# Patient Record
Sex: Female | Born: 2000 | Race: White | Hispanic: No | Marital: Single | Smoking: Never smoker
Health system: Southern US, Community
[De-identification: ages and names within clinical notes are randomized; demographics above are authoritative.]

## PROBLEM LIST (undated history)

## (undated) DIAGNOSIS — J45909 Unspecified asthma, uncomplicated: Secondary | ICD-10-CM

---
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
## 2022-09-23 DIAGNOSIS — R300 Dysuria: Secondary | ICD-10-CM | POA: Diagnosis not present

## 2022-10-04 DIAGNOSIS — F411 Generalized anxiety disorder: Secondary | ICD-10-CM | POA: Diagnosis not present

## 2022-10-04 DIAGNOSIS — F422 Mixed obsessional thoughts and acts: Secondary | ICD-10-CM | POA: Diagnosis not present

## 2022-10-10 DIAGNOSIS — J0100 Acute maxillary sinusitis, unspecified: Secondary | ICD-10-CM | POA: Diagnosis not present

## 2022-11-04 DIAGNOSIS — R051 Acute cough: Secondary | ICD-10-CM | POA: Diagnosis not present

## 2022-11-04 DIAGNOSIS — R0981 Nasal congestion: Secondary | ICD-10-CM | POA: Diagnosis not present

## 2022-11-04 DIAGNOSIS — J324 Chronic pansinusitis: Secondary | ICD-10-CM | POA: Diagnosis not present

## 2022-11-18 DIAGNOSIS — F411 Generalized anxiety disorder: Secondary | ICD-10-CM | POA: Diagnosis not present

## 2022-12-19 DIAGNOSIS — N3091 Cystitis, unspecified with hematuria: Secondary | ICD-10-CM | POA: Diagnosis not present

## 2022-12-19 DIAGNOSIS — R300 Dysuria: Secondary | ICD-10-CM | POA: Diagnosis not present

## 2022-12-19 DIAGNOSIS — R809 Proteinuria, unspecified: Secondary | ICD-10-CM | POA: Diagnosis not present

## 2022-12-19 DIAGNOSIS — R319 Hematuria, unspecified: Secondary | ICD-10-CM | POA: Diagnosis not present

## 2022-12-19 DIAGNOSIS — R8281 Pyuria: Secondary | ICD-10-CM | POA: Diagnosis not present

## 2023-01-13 DIAGNOSIS — F411 Generalized anxiety disorder: Secondary | ICD-10-CM | POA: Diagnosis not present

## 2023-02-05 DIAGNOSIS — Z0000 Encounter for general adult medical examination without abnormal findings: Secondary | ICD-10-CM | POA: Diagnosis not present

## 2023-02-05 DIAGNOSIS — Z118 Encounter for screening for other infectious and parasitic diseases: Secondary | ICD-10-CM | POA: Diagnosis not present

## 2023-02-05 DIAGNOSIS — Z113 Encounter for screening for infections with a predominantly sexual mode of transmission: Secondary | ICD-10-CM | POA: Diagnosis not present

## 2023-02-19 DIAGNOSIS — M545 Low back pain, unspecified: Secondary | ICD-10-CM | POA: Diagnosis not present

## 2023-02-19 DIAGNOSIS — G43009 Migraine without aura, not intractable, without status migrainosus: Secondary | ICD-10-CM | POA: Diagnosis not present

## 2023-02-27 DIAGNOSIS — J4550 Severe persistent asthma, uncomplicated: Secondary | ICD-10-CM | POA: Diagnosis not present

## 2023-02-27 DIAGNOSIS — J3089 Other allergic rhinitis: Secondary | ICD-10-CM | POA: Diagnosis not present

## 2023-02-27 DIAGNOSIS — J301 Allergic rhinitis due to pollen: Secondary | ICD-10-CM | POA: Diagnosis not present

## 2023-03-04 DIAGNOSIS — M545 Low back pain, unspecified: Secondary | ICD-10-CM | POA: Diagnosis not present

## 2023-03-12 DIAGNOSIS — F411 Generalized anxiety disorder: Secondary | ICD-10-CM | POA: Diagnosis not present

## 2023-03-16 DIAGNOSIS — R0981 Nasal congestion: Secondary | ICD-10-CM | POA: Diagnosis not present

## 2023-03-16 DIAGNOSIS — R059 Cough, unspecified: Secondary | ICD-10-CM | POA: Diagnosis not present

## 2023-05-12 DIAGNOSIS — R300 Dysuria: Secondary | ICD-10-CM | POA: Diagnosis not present

## 2023-06-06 DIAGNOSIS — F411 Generalized anxiety disorder: Secondary | ICD-10-CM | POA: Diagnosis not present

## 2023-09-25 ENCOUNTER — Encounter (HOSPITAL_BASED_OUTPATIENT_CLINIC_OR_DEPARTMENT_OTHER): Payer: Self-pay | Admitting: Emergency Medicine

## 2023-09-25 ENCOUNTER — Other Ambulatory Visit: Payer: Self-pay

## 2023-09-25 ENCOUNTER — Emergency Department (HOSPITAL_BASED_OUTPATIENT_CLINIC_OR_DEPARTMENT_OTHER)
Admission: EM | Admit: 2023-09-25 | Discharge: 2023-09-25 | Disposition: A | Discharge status: Home / Self Care | Attending: Emergency Medicine | Admitting: Emergency Medicine

## 2023-09-25 DIAGNOSIS — J45909 Unspecified asthma, uncomplicated: Secondary | ICD-10-CM | POA: Diagnosis not present

## 2023-09-25 DIAGNOSIS — N3000 Acute cystitis without hematuria: Secondary | ICD-10-CM | POA: Insufficient documentation

## 2023-09-25 DIAGNOSIS — R059 Cough, unspecified: Secondary | ICD-10-CM | POA: Diagnosis not present

## 2023-09-25 LAB — RESP PANEL BY RT-PCR (RSV, FLU A&B, COVID)  RVPGX2
Influenza A by PCR: NEGATIVE
Influenza B by PCR: NEGATIVE
Resp Syncytial Virus by PCR: NEGATIVE
SARS Coronavirus 2 by RT PCR: NEGATIVE

## 2023-09-25 LAB — URINALYSIS, ROUTINE W REFLEX MICROSCOPIC
Bilirubin Urine: NEGATIVE
Glucose, UA: NEGATIVE mg/dL
Hgb urine dipstick: NEGATIVE
Ketones, ur: NEGATIVE mg/dL
Nitrite: NEGATIVE
Protein, ur: NEGATIVE mg/dL
Specific Gravity, Urine: 1.005 (ref 1.005–1.030)
pH: 5 (ref 5.0–8.0)

## 2023-09-25 MED ORDER — IPRATROPIUM-ALBUTEROL 0.5-2.5 (3) MG/3ML IN SOLN
3.0000 mL | Freq: Once | RESPIRATORY_TRACT | Status: AC
  Administered 2023-09-25: 3 mL via RESPIRATORY_TRACT
  Filled 2023-09-25: qty 3

## 2023-09-25 MED ORDER — CEPHALEXIN 500 MG PO CAPS: 500.0000 mg | ORAL_CAPSULE | Freq: Three times a day (TID) | ORAL | 0 refills | Status: AC

## 2023-09-25 MED ORDER — IPRATROPIUM-ALBUTEROL 0.5-2.5 (3) MG/3ML IN SOLN: 3.0000 mL | RESPIRATORY_TRACT | 0 refills | Status: AC | PRN

## 2023-09-25 MED ORDER — PREDNISONE 20 MG PO TABS: 40.0000 mg | ORAL_TABLET | Freq: Every day | ORAL | 0 refills | Status: AC

## 2023-09-25 MED ORDER — CEPHALEXIN 250 MG PO CAPS
500.0000 mg | ORAL_CAPSULE | Freq: Once | ORAL | Status: AC
  Administered 2023-09-25: 500 mg via ORAL
  Filled 2023-09-25: qty 2

## 2023-09-25 MED ORDER — PREDNISONE 50 MG PO TABS
50.0000 mg | ORAL_TABLET | Freq: Once | ORAL | Status: AC
  Administered 2023-09-25: 50 mg via ORAL
  Filled 2023-09-25: qty 1

## 2023-09-25 NOTE — ED Triage Notes (Signed)
 Sinus pressure cough headache  Some sob with with hx of asthma X 2 weeks getting worse  Used inhaler and neb treatment without relief

## 2023-09-25 NOTE — ED Provider Notes (Signed)
 [place] EMERGENCY DEPARTMENT AT [facility] Provider Note   CSN: [id number] Arrival date & time: 09/25/23  1703     History Chief Complaint  Patient presents with   Cough    HPI Sarah Murillo is a 23 y.o. female presenting for cough and SOB. States that she is also having some dysuria.  States that urinary tract infections are acute on chronic from prior.  She denies fevers chills nausea vomiting syncope. Concerning her wheezing, she states that she has a history of asthma.  Is on Trelegy has been compliant but still having ongoing symptoms requiring very frequent utilization of her albuterol inhaler.  Patient's recorded medical, surgical, social, medication list and allergies were reviewed in the Snapshot window as part of the initial history.   Review of Systems   Review of Systems  Constitutional:  Negative for chills and fever.  HENT:  Negative for ear pain and sore throat.   Eyes:  Negative for pain and visual disturbance.  Respiratory:  Positive for shortness of breath. Negative for cough.   Cardiovascular:  Negative for chest pain and palpitations.  Gastrointestinal:  Negative for abdominal pain and vomiting.  Genitourinary:  Negative for dysuria and hematuria.  Musculoskeletal:  Negative for arthralgias and back pain.  Skin:  Negative for color change and rash.  Neurological:  Negative for seizures and syncope.  All other systems reviewed and are negative.   Physical Exam Updated Vital Signs BP 121/64 (BP Location: Right Arm)   Pulse 94   Temp 97.8 F (36.6 C)   Resp 17   LMP 09/04/2023 (Approximate)   SpO2 100%  Physical Exam Vitals and nursing note reviewed.  Constitutional:      General: She is not in acute distress.    Appearance: She is well-developed.  HENT:     Head: Normocephalic and atraumatic.  Eyes:     Conjunctiva/sclera: Conjunctivae normal.  Cardiovascular:     Rate and Rhythm: Normal rate and regular rhythm.     Heart  sounds: No murmur heard. Pulmonary:     Effort: Pulmonary effort is normal. No respiratory distress.     Breath sounds: Normal breath sounds.  Abdominal:     General: There is no distension.     Palpations: Abdomen is soft.     Tenderness: There is no abdominal tenderness. There is no right CVA tenderness or left CVA tenderness.  Musculoskeletal:        General: No swelling or tenderness. Normal range of motion.     Cervical back: Neck supple.  Skin:    General: Skin is warm and dry.  Neurological:     General: No focal deficit present.     Mental Status: She is alert and oriented to person, place, and time. Mental status is at baseline.     Cranial Nerves: No cranial nerve deficit.      ED Course/ Medical Decision Making/ A&P    Procedures Procedures   Medications Ordered in ED Medications  cephALEXin (KEFLEX) capsule 500 mg (has no administration in time range)  ipratropium-albuterol (DUONEB) 0.5-2.5 (3) MG/3ML nebulizer solution 3 mL (3 mLs Nebulization Given 09/25/23 1949)  predniSONE (DELTASONE) tablet 50 mg (50 mg Oral Given 09/25/23 1951)    Medical Decision Making:   23 year old female with 2 separate complaints. Concerning her shortness of breath her history of present illness and physical exam findings are most consistent with asthma exacerbation likely secondary to environmental exposure, time a year/seasonal allergy disease. Also  considered infectious pathology but she was negative for any viral pathology on her swab. Will treat with inhaled bronchodilators, p.o. steroids.  Concerning her dysuria, she has bacteriuria and multiple white blood cells in her urine.  This likely represents a UTI given her feeling of dysuria.  Will treat with Keflex twice daily p.o. recommend follow-up with PCP in 5 days to ensure clearance.  Disposition:  I have considered need for hospitalization, however, considering all of the above, I believe this patient is stable for discharge at  this time.  Patient/family educated about specific return precautions for given chief complaint and symptoms.  Patient/family educated about follow-up with PCP.     Patient/family expressed understanding of return precautions and need for follow-up. Patient spoken to regarding all imaging and laboratory results and appropriate follow up for these results. All education provided in verbal form with additional information in written form. Time was allowed for answering of patient questions. Patient discharged.    Emergency Department Medication Summary:   Medications  cephALEXin (KEFLEX) capsule 500 mg (has no administration in time range)  ipratropium-albuterol (DUONEB) 0.5-2.5 (3) MG/3ML nebulizer solution 3 mL (3 mLs Nebulization Given 09/25/23 1949)  predniSONE (DELTASONE) tablet 50 mg (50 mg Oral Given 09/25/23 1951)        Clinical Impression:  1. Uncomplicated asthma, unspecified asthma severity, unspecified whether persistent   2. Acute cystitis without hematuria      Discharge   Final Clinical Impression(s) / ED Diagnoses Final diagnoses:  Uncomplicated asthma, unspecified asthma severity, unspecified whether persistent  Acute cystitis without hematuria    Rx / DC Orders ED Discharge Orders          Ordered    cephALEXin (KEFLEX) 500 MG capsule  3 times daily        09/25/23 2041    ipratropium-albuterol (DUONEB) 0.5-2.5 (3) MG/3ML SOLN  Every 4 hours PRN        09/25/23 2042    predniSONE (DELTASONE) 20 MG tablet  Daily        09/25/23 2042              Glyn Ade, MD 09/25/23 2042

## 2023-09-25 NOTE — ED Notes (Signed)
 Discharge instructions reviewed.   Newly prescribed medications discussed. Pharmacy verified.   Opportunity for questions and concerns provided.   Alert, oriented and ambulatory. Displays no signs of distress.

## 2023-09-28 LAB — URINE CULTURE: Culture: 20000 — AB

## 2023-09-29 ENCOUNTER — Telehealth (HOSPITAL_BASED_OUTPATIENT_CLINIC_OR_DEPARTMENT_OTHER): Payer: Self-pay | Admitting: *Deleted

## 2023-09-29 NOTE — Telephone Encounter (Signed)
   Post ED Visit - Positive Culture Follow-up  Culture report reviewed by antimicrobial stewardship pharmacist: [facility] []  Enzo Bi, Pharm.D. [x]  JTony Rudsisill, Pharm.D., BCPS AQ-ID []  Garvin Fila, Pharm.D., BCPS []  Georgina Pillion, Pharm.D., BCPS []  [place], [address].D., BCPS, AAHIVP []  Estella Husk, Pharm.D., BCPS, AAHIVP []  Lysle Pearl, PharmD, BCPS []  Phillips Climes, PharmD, BCPS []  Agapito Games, PharmD, BCPS []  Verlan Friends, PharmD []  Mervyn Gay, PharmD, BCPS []  Vinnie Level, PharmD  [facility] []  Len Childs, PharmD []  Greer Pickerel, PharmD []  Adalberto Cole, PharmD []  Perlie Gold, Rph []  Lonell Face) Jean Rosenthal, PharmD []  Earl Many, PharmD []  Junita Push, PharmD []  Dorna Leitz, PharmD []  Terrilee Files, PharmD []  Lynann Beaver, PharmD []  Keturah Barre, PharmD []  Loralee Pacas, PharmD []  Bernadene Person, PharmD   Positive urine culture Treated with Cephalexin, organism sensitive to the same and no further patient follow-up is required at this time.  Bing Quarry 09/29/2023, 8:57 AM

## 2023-11-03 DIAGNOSIS — Z3200 Encounter for pregnancy test, result unknown: Secondary | ICD-10-CM | POA: Diagnosis not present

## 2023-11-03 DIAGNOSIS — N926 Irregular menstruation, unspecified: Secondary | ICD-10-CM | POA: Diagnosis not present

## 2023-11-04 DIAGNOSIS — F411 Generalized anxiety disorder: Secondary | ICD-10-CM | POA: Diagnosis not present

## 2023-11-10 DIAGNOSIS — M549 Dorsalgia, unspecified: Secondary | ICD-10-CM | POA: Diagnosis not present

## 2023-11-10 DIAGNOSIS — Z6836 Body mass index (BMI) 36.0-36.9, adult: Secondary | ICD-10-CM | POA: Diagnosis not present

## 2023-11-12 DIAGNOSIS — M545 Low back pain, unspecified: Secondary | ICD-10-CM | POA: Diagnosis not present

## 2023-11-14 DIAGNOSIS — M545 Low back pain, unspecified: Secondary | ICD-10-CM | POA: Diagnosis not present

## 2023-12-09 DIAGNOSIS — Z713 Dietary counseling and surveillance: Secondary | ICD-10-CM | POA: Diagnosis not present

## 2023-12-09 DIAGNOSIS — Z6835 Body mass index (BMI) 35.0-35.9, adult: Secondary | ICD-10-CM | POA: Diagnosis not present

## 2023-12-09 DIAGNOSIS — N914 Secondary oligomenorrhea: Secondary | ICD-10-CM | POA: Diagnosis not present

## 2023-12-15 DIAGNOSIS — Z3169 Encounter for other general counseling and advice on procreation: Secondary | ICD-10-CM | POA: Diagnosis not present

## 2023-12-26 DIAGNOSIS — Z319 Encounter for procreative management, unspecified: Secondary | ICD-10-CM | POA: Diagnosis not present

## 2023-12-26 DIAGNOSIS — Z3169 Encounter for other general counseling and advice on procreation: Secondary | ICD-10-CM | POA: Diagnosis not present

## 2023-12-31 DIAGNOSIS — Z319 Encounter for procreative management, unspecified: Secondary | ICD-10-CM | POA: Diagnosis not present

## 2024-01-05 DIAGNOSIS — Z319 Encounter for procreative management, unspecified: Secondary | ICD-10-CM | POA: Diagnosis not present

## 2024-01-05 DIAGNOSIS — Z3169 Encounter for other general counseling and advice on procreation: Secondary | ICD-10-CM | POA: Diagnosis not present

## 2024-01-09 ENCOUNTER — Ambulatory Visit: Admission: RE | Admit: 2024-01-09 | Discharge: 2024-01-09 | Disposition: A | Discharge status: Home / Self Care

## 2024-01-09 ENCOUNTER — Ambulatory Visit
Admission: RE | Admit: 2024-01-09 | Discharge: 2024-01-09 | Disposition: A | Discharge status: Home / Self Care | Source: Ambulatory Visit

## 2024-01-09 ENCOUNTER — Other Ambulatory Visit: Payer: Self-pay

## 2024-01-09 DIAGNOSIS — J301 Allergic rhinitis due to pollen: Secondary | ICD-10-CM | POA: Diagnosis not present

## 2024-01-09 DIAGNOSIS — J3089 Other allergic rhinitis: Secondary | ICD-10-CM | POA: Diagnosis not present

## 2024-01-09 DIAGNOSIS — J069 Acute upper respiratory infection, unspecified: Secondary | ICD-10-CM

## 2024-01-09 DIAGNOSIS — J4550 Severe persistent asthma, uncomplicated: Secondary | ICD-10-CM | POA: Diagnosis not present

## 2024-01-09 DIAGNOSIS — J984 Other disorders of lung: Secondary | ICD-10-CM | POA: Diagnosis not present

## 2024-01-12 DIAGNOSIS — Z3169 Encounter for other general counseling and advice on procreation: Secondary | ICD-10-CM | POA: Diagnosis not present

## 2024-01-19 DIAGNOSIS — Z3169 Encounter for other general counseling and advice on procreation: Secondary | ICD-10-CM | POA: Diagnosis not present

## 2024-01-26 DIAGNOSIS — R070 Pain in throat: Secondary | ICD-10-CM | POA: Diagnosis not present

## 2024-01-26 DIAGNOSIS — H6692 Otitis media, unspecified, left ear: Secondary | ICD-10-CM | POA: Diagnosis not present

## 2024-01-27 DIAGNOSIS — Z3169 Encounter for other general counseling and advice on procreation: Secondary | ICD-10-CM | POA: Diagnosis not present

## 2024-01-27 NOTE — Progress Notes (Signed)
 Comprehensive Clinical Assessment   Sarah Murillo  MRN: [id number] September 04, 2000  [id number] - (Managed Care Medicaid)   Time in: 2:42pm Time out: 3:41  Telehealth Patient Information: Patient location during encounter: Home   Audio-only telehealth visit:  No Mode of communication: EPIC Mychart Time spent with patient: 59  minutes Therapist spent 45 minutes reviewing pt chart, documenting, and coordinating care for pt treatment.  Translation services needed via [employer]?  []  Yes  Presenting Problem: Sarah Murillo is a 23 y.o. Caucasian female who presents to the clinic today for a CCA with the following concern(s): PT in for initial CCA evaluation. Says she is seeing another psychiatrist and is looking to switch. Says that she has anxiety, depression, and OCD. Pt was referred by Mom.   Patient Demographics Marital Status:  []  Single     [x]  Married      []  Widowed      []  Separated/Divorced    []  Other Says she got married in April 2025  Sexual Orientation: [x]  Heterosexual orientation   []  Homosexual orientation   []   Bisexual   []   Other  Gender Identity: [x]  Female   []  Female   []   Transgender Female   []  Transgender Female   []  Choose not to disclose   []   Don't Know   []   Unknown    []  Other Preferred Pronouns:     Affirmation Steps Patient has taken, if any (if applicable): [x]  Presentation aligned with gender identity   []  preferred name aligned with gender identity   []   Legal name aligned with gender identity   []  legal sex aligned with gender identity   []  medical or surgical interventions   []   Don't Know   []   Unknown     []  Other/Not Applicable    Who does the the patient reside with? 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    Military History: []  Yes   [x]  No     Patient Strengths, Engagement, and Support System:  [x]  Accepts Feedback           [x]  Capable of Independence     [x]   Clear Thinking  [x]  Confident                        [x]  Expressive/Articulate             [x]   Good Personal Care Habits    [x]  Motivated for Change    [x]  Physically Healthy                  [  x] Positive Support Network    [x]   Reasonable Judgment   [x]  Responsible                 [x]   Stable Living Environment   [x]  Stable Work History       [x]  Supportive Family     [x]  Coping Skills/Resilience    [x]   Integrate Moral Values  [x]  Varied Interests                      [x]  Reliable   [x]  Sociable              [x]  Intelligent        [x]  Insightful  []   Other:      [x]  Currently  []  Formally   []  N/A   active in community/recreational activities/hobbies  Crochet, read books, take dog on walks, hanging out with family, and do photography   Natural/Social Supports: [x]  Supportive network   []  Few supports/ friends  []  Substance related supports   []  No supports  Husband, mom, friends   Electrical Engineer Identity and Religious Beliefs:   Christian   Family History and Family Psychiatric History:  Specify history of mental health illness in your family?   Yes [x]         No[]  Mom, dad and sisters all has anxiety and depression.      Family Member (Information on significant other, siblings, children, pets): Mom and dad, 2 older sisters, also has 2 dogs.     How would you describe your childhood experience?  Normal Environment[x]            Chaotic Environment []   Witnessed physical/verbal/sexual abuse towards others[]      Experienced physical/verbal/sexual abuse[]  Denies    Have you had any recent grief/loss/bereavement?:   Yes []     No[]  Pt reports dad died 2 yrs ago and granddad passed away 2 weeks before her wedding in March    Behavioral and Psychiatric Treatment History:  Prior outpatient therapy or psychiatrist: Therapy [x]   Has been seeing her current psychiatrist for 2 years. Says things are not working out well with them. Says she went to a couple family therapy sessions for her sisters but nothing for herself.   Hospitalizations/ Inpatient Treatment []   Denies  Medication Management[]     Other[]      Medical History: Does the client have any medical conditions?     Yes [x]   No[]  No past medical history on file. Patient Active Problem List  Diagnosis   GAD (generalized anxiety disorder)   Adjustment disorder with depressed mood   Other specified trauma- and stressor-related disorder       History of Traumatic Brain Injury?   Yes []     No[x]      Do you have a Primary Care doctor? Yes [x]      No[]  Dr. Harlene Leff Last Visit/ Physical: Has an appt with them for a yrly physical in 2 wks.     Allergies: Yes []    No[x]           Not on File     Is client taking any OTC, or Rx Medication (Psychotropic/Primary Care): Yes [x]    No []      No current outpatient medications on file.  Takes Cytalipram 40mg  Buspirone 40mg  Trilagy inhaler  Albuterol  inhaler Montelukast 10mg  Hydricizine 25mg  Fluctason     Is client in any pain?: Yes []    No[x]       If  yes, are they being treated for pain? Yes []    No []    No past surgical history on file. No family history on file.    Does the client have Advance Directives in Place?   Yes []      No []                 (If yes, please include information on the effective date and whether resuscitation and/or life support are set in place?)   If no, has the pt had previous discussions about Advance Directives?   Yes []    No []        Not assessed     Nutritional Screening: History of, or, current behaviors indicating an eating disorder (anorexia, bulimia, binging)? Yes []    No[x]  Says that she went on a diet a few yrs ago and loss 50 lbs within a couple months bc she was barely eating. Says she was never diagnosed with an eating dx but def wasn't eating in an effort to lose weight.  Weight gain or weight loss of more than 10lbs in the past 3 months? Yes [x]    No[]  Reports weight gain due to stress eating. Also attributes to infertility meds that she is taking.  Decrease or increase in food intake or appetite? Yes []    No[x]  Have you decreased or increased the amount of food you have consumed? Yes []     No[x]  Issues related to vomiting, nausea, diarrhea, constipation, heartburn/ indigestion?  Yes []    No[x]  Patient is physically active? Yes [x]      No[]  Patient dental Health:   [x]  Good    []  Fair   []  Poor Dental Pain? []   Yes    [x]  No Date of last Dental Exam?   About a yr ago    Referral needed for :   []  Primary Care          []  Dental  For Women:  Are you currently pregnant?   Yes []     No [x]  If so, how far along are you?   Are you receiving prenatal care?   Yes []     No [x]     Are you currently using and substances?  Yes []     No [x]      Do you need a referral for care?  Yes []     No []   Social Determinants of Health Living Situation: [x]  adequate housing []   Dependent on others for housing       []  Homeless []  Other     Are you worried about losing your housing ?   []  Yes [x]  No  Financial concerns: [x]  No concerns   []  Some concerns   []  Poverty or low income    Within the past 12 months, did you worry that your food would run out before you got money to buy more?    []  Yes [x]  No Within the past 12 months, did the food you bought not last and you didn't have money to get more?   []  Yes [x]  No Within the past 12 months, have you or your family members you live with been unable to get utilities (heat, electricity)  when it was really needed ?   []  Yes [x]  No  Do you feel physically and emotionally safe where you currently live?  [x]  Yes []  No Within the past 12 months, have you been hit, slapped, kicked or otherwise physically hurt by someone?   []  Yes [x]  No  Within the past 12 months, have you been humiliated or emotionally abused in other ways by your partner or ex-partner?   []  Yes [x]  No Are any of your needs urgent? (For example, I dont have food for tonight, I dont have a place to sleep tonight, I am afraid I will get hurt if I go home today?)   []  Yes [x]  No   Social Drivers of Health   Tobacco Use: Low Risk  (09/25/2023)   Received from [facility]   Patient History    Smoking Tobacco Use: Never    Smokeless Tobacco Use: Never    Passive Exposure: Not on file  Alcohol  Use: Not on file  Financial Resource Strain: Not on file  Food Insecurity: Not on file  Transportation Needs: Not on file  Housing Stability: Not on file  Physical Activity: Not on file  Stress: Not on file  Social Connections: Not on file  Intimate Partner Violence: Not on file  Depression: Mild depression (01/27/2024)   PHQ-9    PHQ-9 Score: 9  Health Literacy: Not on file     Psychosocial Stressors: []  Problems with Primary Support    []  Problems related to the Social Environment []  Educational Problems []  Occupational Problems   []  Housing Problems     []  Economic Problems []  Problems with Access to Healthcare   []  Problems related to Interactions with Legal []  Other Psychosocial/Environmental Problems [x]  No reported psychosocial stressors  Legal History: Does the client have or has had any issues with the law? Yes []  No[x]  Legal History:     Total time served in Jail/ prison:      Present Problem and Symptoms:  Depression                  [] Depressed Mood                [x] Irritable Mood [] Hypersomnia [x] Difficulty with sleep initiation                 [x] Difficulty with sleep maintenance                  [x] Wakening throughout night                [] Other Sleep Disturbance                 [] Anhedonia [] Feelings of Guilt                 [] Hopelessness                 [x] Decreased energy level                 [] Difficulty concentrating and maintaining focus on tasks                 [] Decreased Appetite                 [] Increased Appetite                 [] Psychomotor Retardation or agitation                 [] Suicidal Ideation                 [] Other   Patient Health Questionnaire-9 Score: (Patient-Rptd) 9 (01/27/2024  2:32 PM) PHQ2/9 Score Interpretation: (Patient-Rptd) Mild symptoms of depression (01/27/2024  2:32 PM)   PHQ9 - Patient reporting PHQ score of 9 indicating mild depression  Over the  past 2 weeks, how often have you been bothered by any of the following problems? Little interest or pleasure in doing things: (Patient-Rptd) Not at all Feeling down, depressed, or hopeless: (Patient-Rptd) Several days Patient Health Questionnaire-2 Score: (Patient-Rptd) 1  Over the past 2 weeks, how often have you been bothered by any of the following problems? Trouble falling or staying asleep, or sleeping too much: (Patient-Rptd) Nearly every day Feeling tired or having little energy: (Patient-Rptd) Nearly every day Poor appetite or overeating: (Patient-Rptd) Several days Feeling bad about yourself - or that you are a failure or have let yourself or your family down: (Patient-Rptd) Several days Trouble concentrating on things, such as reading the newspaper or watching television: (Patient-Rptd) Not at all Moving or speaking so slowly that other people could have noticed? Or the opposite - being so fidgety or restless that you have been moving around a lot more than usual.: (Patient-Rptd) Not at all Thoughts that you would be better off dead or hurting yourself in some way: (Patient-Rptd) Not at all Patient Health Questionnaire-9 Score: (Patient-Rptd) 9  If you checked off any  problems on this questionnaire, How difficult have these problems made it for you to do your work, take care of things at home, or get along with other people?: (Patient-Rptd) Somewhat difficult   Denies symptoms as she feels that the irritability is due to infertility meds. Reports having issues with insomnia that has progressively gotten worse over the last 3 months. Says that she has issues going to sleep, staying asleep, and sleeping for a full 8 hrs. Says only getting 2-3 hrs of sleep multiple nights a week.    Psychosis [] Auditory Hallucinations                 [] Visual Hallucinations                 [] Persecutory Delusions                 [] Suspicious/Paranoid          [] Other Disorganized thinking   [] inability to care for self [] Poor personal hygiene [] Bizarre behavior [] Withdrawn       Denies   Anxiety [x] Difficulty to control the worry                [x] Restlessness                 [x] Easily fatigues                 [x] Difficulty concentrating                 [x] Irritability                 [x] Muscle tension                 [x] Sleep disturbance        [] Other    GAD 7 - Patient Reporting GAD score of 13 indicating MODERATE anxiety  Over the last 2 weeks, how often have you been bothered by any of the following problems? Feeling nervous, anxious, or on edge: Several days Not being able to stop or control worrying: Several days Worrying too much about different things: Nearly every day Trouble relaxing: Nearly every day Being so restless that it is hard to sit still: Several days Becoming easily annoyed or irritable: Nearly every day Feeling afraid as if something awful might happen: Several days GAD-7 Total Score: 13  If you checked off any problems  on this questionnaire, How difficult have these problems made it for you to do your work, take care of things at home, or get along with other people?: Somewhat difficult     What is the anxiety associated with? Pt  reports that anxiety was very mild before dad passed away but it has gotten very heightened since then. Pt reports constantly waking up in the middle of the night to check on mom and husband to see if they are breathing because her dad passed unexpectedly in his sleep. Says that she checks on husband maybe 1-2 times per night. Says she will have major panic attack if she wakes up and he is not in the bed when he is supposed to be in the bed with her.    Attachment [] Separation Nightmares [] Distress of being away from attachment figure [] Lack of attachment [] Inhibited [] Disinhibited  [] Other  Denies    Panic Disorder [x] Recurrent and unexpected panic attacks                 [] Palpitations                 [] Sweating               [] Trembling or shaking                 [x] Sensation of shortness of breath                 [] Feeling of choking                 [x] Chest pain or discomfort                 [] Nausea or abdominal distress                 [] Feeling dizzy, unsteady, lightheaded or faint                 [] Chills or heat sensations                 [] Parethesias                 [] Derealization                 [] Fear of losing control                 [x] Fear or dying  [] Other    Pt reports having panic attacks/ anxiety in the past. Says the last one was a few months ago. Says she gets overwhelmed quickly. Says they only last about .                Mania [] Elevated Mood                 [] Irritable Mood                 [] Inflated sense of self or grandiose                 [] Decreased need for sleep                 [] More talkative or pressured speech             [] Racing thoughts               [] Distractibility                [] Increase in goal directed activity [] Buying sprees [] Sexual indiscretions [] Risky business expenses   []   Other Denies   OCD                [x] Recurrent or persistent thoughts, urges, or image that are intrusive and unwanted, and causes marked  anxiety or distress                 [x] Attempts to ignore or suppress the thoughts with other activities                 [x] Repetitive behavior performed in response to obsessions and prevent or reduce anxiety                   [] Other  Describe below details about the obsessions and compulsions  Pt reports she has to do stuff a certain amount of time or say the same prayer over and over again. Says she was diagnosed with OCD 49yrs ago.                PTSD [x] Exposed to actual or threatened death, serious injury, or sexual violence.                 [x] Recurrent intrusive memories of the event.                 [] Recurrent distressing dreams.                 [] Dissociative reactions                 [x] Recurrent distress associated with reminders of traumatic event.                 [] Physiological reactions when exposed to reminders of traumatic events.                  [x] Persistent avoidance of stimuli associated with traumatic event.                 [x] Changes in mood or cognition associated with traumatic event.     [] Other     PCL Score: Pt has a positive PTSD screen of 6  Was diagnosed with PTSD from dad passing. Says that even watching tv and hearing about death triggers thoughts of dad and finding him dead. Says that even when she talks about him she still wants to cry. Says if she doesn't think about it it doesn't effect her day to day.    Inattention [] Does not give close attention to details or makes careless mistakes.                [] Difficulty sustaining attention.                 [] Does not seem to listen when spoken to directly.                 [] Does not follow through on instructions and does not complete assignments.              [] Difficulty organizing tasks and activities.                [] Avoids or is reluctant to engage in tasks that require sustained mental effort.                 [] Often loses things necessary for tasks or activities.                 [] Easily  distracted by extraneous stimuli.                 []   Forgetful in daily activities. [] Other   Denies                  Hyperactivity and Impulsivity [] Often fidgets and has difficulty sitting still.                 [] Often leaves seat when expected to remain seated.                 [] Unable to quietly play.                                              [] Runs around during situations that is inappropriate.                 [] Talks excessively.                 [] Blurts out answers to questions.                 [] Difficulty waiting their turn.                 [] Often interrupts others.  Symptoms were present before age 33? []  Where do symptoms occur?                Denies     Oppositional Defiant [] Often loses temper                 [] Touchy or easily annoyed                 [] Often angry and resentful                 [] Often argues with authority                 [] Actively defies with requests from authority figures request to rules                 [] Deliberately annoys others                 [] Often blames others for their mistakes                 []  Often spiteful or vindictive        [] Other                           Denies                       Antisocial behavior/ Conduct issues [] Frequent Lying [] Bullies, threatens, or intimidates others    [] Stealing [] Broken into someone's home or vehicle    [] Has run away from home [] Fire setting                 [] Destroyed property    [] Cruelty to people                 [] Cruelty to animals   [] Initiates physical fights                 [] Has used a weapon than can cause serious physical harm to others    [] Aggressive [] Deceitfulness              [] Truancy [] Issues with the law          [] Promiscuity [] Other   Denies   Trauma History: NO  History of significant losses (frequent moves, loss of friends, deaths, multiple placements, etc.) [x]   Other []    Denies trauma although she endorses that finding her dad passed away from  dying in his sleep was traumatic for her and she still struggles with it.      Substance Use Status: [x] No History of Abuse [] Active Abuse   [] Early Full Remission  [] Early Partial Remission [] Sustained Fully Remission  [] Sustained Partial Remission   [] Other:        Family History of Substance use:  []  Yes     [x]  No If so, who:   Says she was adopted and her birth mom was on drugs (heroine and meth) when she gave birth to her. She says she is not close to biological family and not sure of anything else.     Does client meet criteria for ASAM?   Yes []    No[x]  (If yes, please complete ASAM section below) Does the client meet criteria for [place] TOPP?  Yes []    No[x]      The ASAM Criteria [place]:  Treatment Criteria for Addictive, Substance-Related, and Co-Occurring Conditions   Recommended Level of Care: 0  Tobacco Use History: Social History   Tobacco Use  Smoking Status Not on file  Smokeless Tobacco Not on file   Counseling given: Not Answered  Tobacco history reviewed, intervention initiated as needed and includes: not needed, patient is currently a non-smoker   SUICIDE RISK ASSESSMENT (ASQ) In the past few weeks have you wished you were dead? Yes []    No[x]  In the past few weeks, have you felt that you or you family would be better off if you were dead? Yes []    No[x]  In the past week, have you been having thoughts of killing yourself? Yes []    No[x]  Have you ever tried to kill yourself? Yes []    No[x]  If yes, how did you try to kill yourself?    If yes, when did you try to kill yourself?    Are you having thoughts of killing yourself right now? Yes []    No[x]  If yes, describe your thoughts of killing yourself right now:    Provide any history of self harm or suicidal ideation if applicable: N/A    HOMICIDE RISK ASSESSMENT Are you having any thoughts about harming anyone?   Yes []    No[x]  Have you ever been so upset or angry that you thought of harming  someone?  Yes []    No[x]  Have you tried to kill or harm anyone?  Yes []    No[x]  What stopped you from carrying out thoughts or plan to kill or harm someone?      Do you have access to a gun?  Yes []    No[x]       Mental Status Exam Appearance/Grooming: eye contact good, grooming well kept, and well developed, well nourished  Orientation: Appropriate to age, Person, Place, and Time  Mood/ Affect:  appropriate Memory:   Recent: good  Concentration: good  Speech: Coherent and Regular rate, rhythm, volume and articulation  Thought Processes: Abstract reasoning appropriate to age  Thought Associations: No loosening of associations  Thought Abnormalities or Psychosis: Not present  Judgement: Appropriate to age  Insight: good  Sleep: Pt reports issues with sleeping. Reports only sleeping a few hrs at a time most nights  Appetite: no change    Clinical Summary and Diagnosis:Cella Carvin is a 23 y.o. Caucasian female who presents to the clinic today with  the following concern(s) PT in for initial CCA evaluation. Says she is seeing another psychiatrist and is looking to switch. Says that she has anxiety, depression, and OCD. Pt was referred by Mom.    Patient Demographics:  PT is married, straight female who lives with her husband and mom also lives with them.   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   Patient Strengths, Engagement, and Support System:   Pt reports that her hobbies include crochetting, reading books, taking dog on walks, hanging out with family, and doing photography. She identifies her support system as her husband, mom , and friends.   Family History and Family Psychiatric History:  PT reports that she grew up with mom, dad, and 2 older sisters, pt has 2 dogs, and a husband. Pt reports that Mom, dad and sisters all has anxiety and depression.  Pt reports  that her childhood was normal and she denies witnissing/experiencing any forms of abuse. Pt reports that her dad died 2 yrs ago and granddad passed away 2 weeks before her wedding in March. Pt states that she is still dealing with the grief of the passing of her father.   Behavioral and Psychiatric Treatment History:  PT denies inpatient treatment/hospitilization but reports that she has been seeing her current psychiatrist for 2 years. Says things are not working out well with them. Says she went to a couple family therapy sessions for her sisters but nothing for herself.   Nutritional Screening: Pt reports that in the she went on a diet a few yrs ago and loss 50 lbs within a couple months bc she was barely eating. Says she was never diagnosed with an eating dx but def wasn't eating in an effort to lose weight. Pt also reports weight gain due to stress eating. Also attributes to infertility meds that she is taking.   Social Determinants of Health Denies  Psychosocial Stressors: Denies  Legal History and substance abuse: Pt reports no legal issues and no hx of SA for self. Pt does state that she found out that her biological mother was her dad's sister and although she is not close to them she does know that birth mom was on drugs (heroine and meth) when she gave birth to her.   Present Problem and Symptoms: Pt denies symptoms of depression as she feels that the irritability is due to infertility meds. However she does endorse having issues with insomnia that has progressively gotten worse over the last 3 months. Says that she has issues going to sleep, staying asleep, and sleeping for a full 8 hrs. Says only getting 2-3 hrs of sleep multiple nights a week. Pt reports that she is having symptoms of anxiety such as restlessness, constant worry, and difficulty concentrating/focusing. PT reports that she has had anxiety in the past but reports that anxiety was very mild before dad passed away but it  has gotten very heightened since then. Pt reports constantly waking up in the middle of the night to check on mom and husband to see if they are breathing because her dad passed unexpectedly in his sleep. Says that she checks on husband maybe 1-2 times per night. Says she will have major panic attack if she wakes up and he is not in the bed when he is supposed to be in the bed with her. Patient Reporting GAD score of 13 indicating MODERATE anxiety and  Patient reporting PHQ score of 9 indicating MILD depression. Pt reports having panic attacks/ anxiety in the past. Says the last one was a few months ago. Says she gets overwhelmed quickly. Says they only last about . She indicated that she has chest pains and shortness of breath which scares of even more becase she has asthma and sometimes has to grab her inhaler during a panic attack. Pt denies mania and AI/VI hallucinations. Pt reports she has to do stuff a certain amount of time or say the same prayer over and over again. Says she was diagnosed with OCD 65yrs ago. Pt has a positive PTSD screen of 6. Was diagnosed with PTSD from dad passing. Says that even watching tv and hearing about death triggers thoughts of dad and finding him dead. Says that even when she talks about him she still wants to cry. Says if she doesn't think about it it doesn't effect her day to day. PT denies trauma although she endorses that finding her dad passed away from dying in his sleep was traumatic for her and she still struggles with it. Denies SI/HI. Based on clients report screens, previous records clients symptoms presentation are congruent with diagnosis of MODERATE generlized anxiety disorder, OCD, and PTSD   DSM-V Diagnosis Summary: F41.1 Per pt presentation, history and self report, presents with symptoms meeting criteria for MODERATE generalized anxiety disorder. Pt presents with excessive anxiety and worry, occurring mor days than not for at least 6 months about a  number of events/activities. Pt reports it is difficult to control worry and reports symptoms of: restlessness, feeling keyed up or on edge, being easily fatigued, difficulty concentrating or mind going blank, irritability, muscle tension, and sleep disturbance. The anxiety, worry, or physical symptoms cause clinically significant distress and/or impairment in social, occupational, and other important areas of functioning and are not attributed to physiological effects of substance or better explained by another mental disorder or medical condition.   F42.2 Per pt presentation, history and self report, presents with symptoms meeting criteria for Obsessive Compulsive Disorder. Pt presents with the presence of obsessions, compulsions, or both as evidenced by:  Pt presents with recurrent and persistent thoughts, urges, or images that are experienced, at some time during the disturbance, as intrusive and unwanted, and that in most individuals cause marked anxiety or distress.  Pt attempts to ignore or suppress such thoughts, urges, or images, or neutralize them with some other thought or action (I.e. by performing a compulsion).  Pt presents with repetitive behaviors that pt feels driven to perform in response to an obsession or according to rigid rules.  The behaviors or mental acts are aimed at preventing or reducing anxiety, distress, or to avoid a dreaded event, situation, or outcome.  The pt reports the obsessions or compulsions are time-consuming and/or cause clinically significant distress or impairment.  The disturbance is not attributable to a medication condition, substance use, and/or are not better explained by symptoms of another mental health disorder.  As such, pt meets clinical criteria for OCD (F42.2).   F43.10 Per pt presentation, history and self report, presents with symptoms meeting criteria for Post Traumatic Stress Disorder (PTSD). Pt had exposure to actual or threatened death, serious injury,  or sexual violence in one of the following ways: directly experiencing, witnessing events occur to others, learning that the traumatic event(s) occurred to a close family member/friend., or experiencing repeated or extreme exposure to aversive details of traumatic events. Pt presents with presence of the following  intrusive symptoms associated with the traumatic event(s): recurrent/involuntary/ intrusive distressing memories of event, recurrent distressing dreams in which the content and/or affect of the dream are related to the event, dissociative reactions (e.g. flashbacks, loss of awareness), intense or prolonged psychological distress at exposure to internal or external cues that symbolize or resemble aspect of events, and marked physiological reactions to internal or external cues symbolizing aspect of events. Pt presents with persistent avoidance of stimuli associate with events, beginning after the event occurred as evidence by: avoidance of memories/thoughts/feelings related to event and/or avoidance to avoid external reminders that arouse distressing memories/thoughts/ feelings related to event(s). Pt presents with negative alterations in cognitions and mood, such as: inability to remember details of event, persistent/ exaggerated negative beliefs or expectations of oneself/the world/or others, distorted cognitions about the cause or consequences of event that lead the individual to blame themselves, persistent negative emotional state, diminished interest in activities, feeling detached from others, and inability to experience positive emotions. Pt has expierienced these symptoms for more than 1 month. Symptoms cause clinically significant distress and/or impairment in social, occupational, and other important areas of functioning and are not attributed to physiological effects of substance or better explained by another mental disorder or medical condition.    Issues and goals important to the  patient: To feel better by managing my anxiety so that I can move on from bad memories of finding my dad. I also want to be able to get a full night of sleep.     Clinical Treatment Recommendations: [x]  Outpatient Therapy []  Psychiatric Evaluation [x]  Medication Management []  SAIOP []  Group Therapy []  Higher Level of Care []  Other  Client would like to change her psychiatrist to [facility] for medication management and to start therapy.    Depression Screening Follow up Plan:  Patient Health Questionnaire-9 Score: (Patient-Rptd) 9 (01/27/2024  2:32 PM) PHQ2/9 Score Interpretation: (Patient-Rptd) Mild symptoms of depression (01/27/2024  2:32 PM)      Patient handed off to integrated Internal behavioral health service    Referrals: []  Yes [x]  None Identified      Target Populations:  []  ADSN (Adult with Developmental Disability) [x]  AMI (Adult with Mental Illness)  []  AMVET (Adult MH Veteran and Family) []  ASCDR (Adult SA IV Drug Communicable Disease Risk) []  AMSRE (Adult Mental Health Stable Recovery Population)  []  ASTER ( Adult Substance Abuse Treatment & Engagement) []  ASWOM (Adult SA Woman) []  AMTCL (Adult MH Transition to Community Living) []  ASOUD (Cures Funds for Adult Substance Opiod Use Disorder) []  ASTIM (Adult Stimulant Use Disorder) []  ASCSP (Adults Substance Use Community Supervision Population) []  CDSN ( Child with Developmental Disability)  []  CSMED (Child with Serious Emotional Disturbance) []  CSSAD (Child with SA Disorder) []  GAP (General Assessment Payment)   Comprehensive clinical services were provided via telehealth video. I conducted the video visit and attest that I introduced myself to Luevenia Fire, and provided my credentials and the reason for the video visit. The patient responded yes to consent to conducting telehealth video visit. I confirmed that the patient is currently located in [place]  in which I am licensed to provide clinical care. Maricsa Sammons accepted responsibility for their audio and visual privacy during this visit.  The limits of confidentiality were discussed with pt verbalizing an understanding and accepting said limits.

## 2024-01-28 NOTE — BH Treatment Plan (Signed)
 Clinician met with pt to do initial CCA evaluation and create treatmen plan. Pt would like to start medication management and therapy.

## 2024-03-24 DIAGNOSIS — Z319 Encounter for procreative management, unspecified: Secondary | ICD-10-CM | POA: Diagnosis not present

## 2024-06-14 ENCOUNTER — Emergency Department (HOSPITAL_COMMUNITY)

## 2024-06-14 ENCOUNTER — Emergency Department (HOSPITAL_COMMUNITY)
Admission: EM | Admit: 2024-06-14 | Discharge: 2024-06-15 | Discharge status: Against Medical Advice | Attending: Emergency Medicine | Admitting: Emergency Medicine

## 2024-06-14 ENCOUNTER — Other Ambulatory Visit: Payer: Self-pay

## 2024-06-14 ENCOUNTER — Encounter (HOSPITAL_COMMUNITY): Payer: Self-pay

## 2024-06-14 DIAGNOSIS — R058 Other specified cough: Secondary | ICD-10-CM | POA: Diagnosis not present

## 2024-06-14 DIAGNOSIS — B349 Viral infection, unspecified: Secondary | ICD-10-CM | POA: Diagnosis not present

## 2024-06-14 DIAGNOSIS — R0789 Other chest pain: Secondary | ICD-10-CM | POA: Diagnosis present

## 2024-06-14 DIAGNOSIS — J9801 Acute bronchospasm: Secondary | ICD-10-CM | POA: Diagnosis not present

## 2024-06-14 DIAGNOSIS — Z5321 Procedure and treatment not carried out due to patient leaving prior to being seen by health care provider: Secondary | ICD-10-CM | POA: Diagnosis not present

## 2024-06-14 DIAGNOSIS — R0602 Shortness of breath: Secondary | ICD-10-CM | POA: Diagnosis not present

## 2024-06-14 DIAGNOSIS — R059 Cough, unspecified: Secondary | ICD-10-CM | POA: Diagnosis present

## 2024-06-14 NOTE — ED Triage Notes (Signed)
 Quick triage note: Pt to ED c/o CP, cough and SHOB that started this evening , reports dx with bronchitis 1 week ago and taking abx as prescribed. Hx asthma

## 2024-06-14 NOTE — ED Triage Notes (Signed)
 Pt c/o non radiating midsternal chest pain and left rib pain, SOB since 1900 today. Pt c/o productive cough w/yellow [id number]

## 2024-06-15 ENCOUNTER — Encounter (HOSPITAL_BASED_OUTPATIENT_CLINIC_OR_DEPARTMENT_OTHER): Payer: Self-pay | Admitting: *Deleted

## 2024-06-15 ENCOUNTER — Emergency Department (HOSPITAL_BASED_OUTPATIENT_CLINIC_OR_DEPARTMENT_OTHER)
Admission: EM | Admit: 2024-06-15 | Discharge: 2024-06-15 | Disposition: A | Discharge status: Home / Self Care | Attending: Emergency Medicine | Admitting: Emergency Medicine

## 2024-06-15 ENCOUNTER — Other Ambulatory Visit: Payer: Self-pay

## 2024-06-15 DIAGNOSIS — B349 Viral infection, unspecified: Secondary | ICD-10-CM

## 2024-06-15 DIAGNOSIS — J9801 Acute bronchospasm: Secondary | ICD-10-CM | POA: Insufficient documentation

## 2024-06-15 LAB — CBC
HCT: 37 % (ref 36.0–46.0)
Hemoglobin: 12.4 g/dL (ref 12.0–15.0)
MCH: 29.2 pg (ref 26.0–34.0)
MCHC: 33.5 g/dL (ref 30.0–36.0)
MCV: 87.3 fL (ref 80.0–100.0)
Platelets: 285 K/uL (ref 150–400)
RBC: 4.24 MIL/uL (ref 3.87–5.11)
RDW: 12 % (ref 11.5–15.5)
WBC: 7.1 K/uL (ref 4.0–10.5)
nRBC: 0 % (ref 0.0–0.2)

## 2024-06-15 LAB — BASIC METABOLIC PANEL WITH GFR
Anion gap: 12 (ref 5–15)
BUN: 10 mg/dL (ref 6–20)
CO2: 24 mmol/L (ref 22–32)
Calcium: 9.8 mg/dL (ref 8.9–10.3)
Chloride: 102 mmol/L (ref 98–111)
Creatinine, Ser: 0.68 mg/dL (ref 0.44–1.00)
GFR, Estimated: >60 mL/min (ref >60)
Glucose, Bld: 97 mg/dL (ref 70–99)
Potassium: 3.9 mmol/L (ref 3.5–5.1)
Sodium: 138 mmol/L (ref 135–145)

## 2024-06-15 LAB — D-DIMER, QUANTITATIVE: D-Dimer, Quant: 0.43 ug{FEU}/mL (ref 0.00–0.50)

## 2024-06-15 LAB — TROPONIN T, HIGH SENSITIVITY: Troponin T High Sensitivity: <15 ng/L (ref 0–19)

## 2024-06-15 LAB — HCG, SERUM, QUALITATIVE: Preg, Serum: NEGATIVE

## 2024-06-15 MED ORDER — HYDROCOD POLI-CHLORPHE POLI ER 10-8 MG/5ML PO SUER: 5.0000 mL | Freq: Every evening | ORAL | 0 refills | Status: AC | PRN

## 2024-06-15 MED ORDER — ALBUTEROL (5 MG/ML) CONTINUOUS INHALATION SOLN: 10.0000 mg/h | INHALATION_SOLUTION | Freq: Once | RESPIRATORY_TRACT | Status: DC

## 2024-06-15 MED ORDER — ALBUTEROL SULFATE (2.5 MG/3ML) 0.083% IN NEBU
10.0000 mg | INHALATION_SOLUTION | Freq: Once | RESPIRATORY_TRACT | Status: AC
  Administered 2024-06-15: 03:00:00 10 mg via RESPIRATORY_TRACT
  Filled 2024-06-15: qty 12

## 2024-06-15 MED ORDER — ACETAMINOPHEN 325 MG PO TABS
650.0000 mg | ORAL_TABLET | Freq: Once | ORAL | Status: AC
  Administered 2024-06-15: 03:00:00 650 mg via ORAL
  Filled 2024-06-15: qty 2

## 2024-06-15 MED ORDER — DEXAMETHASONE 4 MG PO TABS
12.0000 mg | ORAL_TABLET | Freq: Once | ORAL | Status: AC
  Administered 2024-06-15: 03:00:00 12 mg via ORAL
  Filled 2024-06-15: qty 3

## 2024-06-15 NOTE — ED Notes (Signed)
 Pt left stating she was going to another facility because of the wait time

## 2024-06-15 NOTE — ED Provider Notes (Signed)
 [place] EMERGENCY DEPARTMENT AT [facility] Provider Note   CSN: [id number] Arrival date & time: 06/15/24  0106     Patient presents with: Chief complaint-shortness of breath   Sarah Murillo is a 23 y.o. female.   The history is provided by the patient and a parent.  Patient with history of asthma presents with ongoing cough, wheezing, shortness of breath and chest pain Patient reports over a week ago she started having upper respiratory symptoms with cough and wheezing.  She was seen in outside urgent care and diagnosed with bronchitis and given Augmentin and prednisone .  She has completed both of those medications.  However her cough has continued.  She reports continued dry cough without hemoptysis and shortness of breath.  She also reports pleuritic type pain over the past several days.  No fevers or vomiting.  No travel reported.  No lower extremity edema.  She takes multiple medications for her asthma at baseline.  No previous history of VTE/CAD.  She is a non-smoker    Past Medical History:  Diagnosis Date   Asthma     Prior to Admission medications  Medication Sig Start Date End Date Taking? Authorizing Provider  chlorpheniramine-HYDROcodone (TUSSIONEX) 10-8 MG/5ML Take 5 mLs by mouth at bedtime as needed for cough. 06/15/24  Yes Midge Golas, MD  ATROVENT HFA 17 MCG/ACT inhaler SMARTSIG:2 Puff(s) By Mouth Every 4-6 Hours PRN 08/18/20   [provider]  citalopram (CELEXA) 10 MG tablet Take 10 mg by mouth daily. 12/18/20   [provider]  ipratropium-albuterol  (DUONEB) 0.5-2.5 (3) MG/3ML SOLN Take 3 mLs by nebulization every 4 (four) hours as needed for up to 20 doses. 09/25/23   Jerral Meth, MD  omeprazole (PRILOSEC) 40 MG capsule Take 40 mg by mouth daily. 12/04/20   [provider]  DOMINIC BECK 200-62.5-25 MCG/INH AEPB Take 1 puff by mouth daily. 12/20/20   [provider]    Allergies: Codeine    Review of  Systems  Respiratory:  Positive for cough, shortness of breath and wheezing.   Cardiovascular:  Negative for leg swelling.  Gastrointestinal:  Negative for vomiting.    Updated Vital Signs BP 116/69   Pulse (!) 102   Temp 98.8 F (37.1 C)   Resp (!) 21   LMP 05/31/2024   SpO2 100%   Physical Exam CONSTITUTIONAL: Well developed/well nourished, mildly anxious HEAD: Normocephalic/atraumatic EYES: EOMI/PERRL ENMT: Mucous membranes moist, no angioedema, uvula midline without erythema or exudates, no stridor or drooling NECK: supple no meningeal signs SPINE/BACK:entire spine nontender CV: S1/S2 noted, tachycardic LUNGS: Tachypnea, scattered wheezing bilaterally, no rales ABDOMEN: soft, nontender NEURO: Pt is awake/alert/appropriate, moves all extremitiesx4.  No facial droop.   EXTREMITIES: pulses normal/equal, full ROM, no lower extremity edema, no calf tenderness SKIN: warm, color normal  (all labs ordered are listed, but only abnormal results are displayed) Labs Reviewed  BASIC METABOLIC PANEL WITH GFR  CBC  HCG, SERUM, QUALITATIVE  D-DIMER, QUANTITATIVE  TROPONIN T, HIGH SENSITIVITY    EKG: EKG Interpretation Date/Time:  Tuesday June 15 2024 02:40:27 EST Ventricular Rate:  91 PR Interval:  142 QRS Duration:  99 QT Interval:  357 QTC Calculation: 440 R Axis:   59  Text Interpretation: Sinus rhythm Confirmed by Midge Golas ([id number]) on 06/15/2024 3:00:17 AM  Radiology: DG Chest 2 View Result Date: 06/14/2024 EXAM: 2 VIEW(S) XRAY OF THE CHEST 06/14/2024 11:03:00 PM COMPARISON: 01/09/2024 CLINICAL HISTORY: sob, cough FINDINGS: LUNGS AND PLEURA: Low lung volumes  with bronchovascular crowding. No pleural effusion. No pneumothorax. HEART AND MEDIASTINUM: No acute abnormality of the cardiac and mediastinal silhouettes. BONES AND SOFT TISSUES: No acute osseous abnormality. IMPRESSION: 1. Low lung volumes with bronchovascular crowding. Electronically signed by: Pinkie Pebbles MD 06/14/2024 11:19 PM EST RP Workstation: [id number]     .Critical Care  Performed by: Midge Golas, MD Authorized by: Midge Golas, MD   Critical care provider statement:    Critical care time (minutes):  45   Critical care start time:  06/15/2024 4:30 AM   Critical care end time:  06/15/2024 5:15 AM   Critical care time was exclusive of:  Separately billable procedures and treating other patients   Critical care was necessary to treat or prevent imminent or life-threatening deterioration of the following conditions:  Respiratory failure and cardiac failure   Critical care was time spent personally by me on the following activities:  Obtaining history from patient or surrogate, examination of patient, evaluation of patient's response to treatment, development of treatment plan with patient or surrogate, ordering and review of laboratory studies, ordering and performing treatments and interventions, pulse oximetry, re-evaluation of patient's condition and review of old charts   I assumed direction of critical care for this patient from another provider in my specialty: no      Medications Ordered in the ED  acetaminophen  (TYLENOL ) tablet 650 mg (650 mg Oral Given 06/15/24 0241)  dexamethasone  (DECADRON ) tablet 12 mg (12 mg Oral Given 06/15/24 0241)  albuterol  (PROVENTIL ) (2.5 MG/3ML) 0.083% nebulizer solution 10 mg (10 mg Nebulization Given 06/15/24 0246)    Clinical Course as of 06/15/24 0517  Tue Jun 15, 2024  0321 Patient presents with ongoing cough wheezing shortness of breath.  She is also reporting pleuritic type chest pain She was seen in urgent care last week and then returned yesterday December 15 was told to go to an ER for a PE workup.  She went to [facility] and had an x-ray and then left without being seen and came here  D-dimer negative, she is overall low risk for PE. Low suspicion for PE at this time.  No acute EKG changes, negative troponin,  low suspicion for ACS or pericarditis.  Patient does have ongoing cough wheezing and mild shortness of breath.  Will give continuous albuterol  nebs and steroids and reassess.  She has already completed a round of antibiotics.  She also had a recent negative viral panel including negative COVID and flu [DW]  0401 Overall patient's work of breathing is improved, though she is now tachycardic.  Will continue to monitor [DW]  [phone] Patient monitored for several hours and appears to be improved.  On reassessment she is sleeping in no distress  Lung sounds are clear.  She is safe for discharge.  She reports continued pain with coughing. PE has been ruled out. She would like to start a cough suppressant.  She reports she can tolerate hydrocodone This has been sent to her pharmacy  If no improvement in the next 48 hours advise follow-up with her pulmonologist [DW]    Clinical Course User Index [DW] Midge Golas, MD                                 Medical Decision Making Amount and/or Complexity of Data Reviewed Labs: ordered. ECG/medicine tests: ordered.  Risk OTC drugs. Prescription drug management.   This patient presents to the ED  for concern of shortness of breath, this involves an extensive number of treatment options, and is a complaint that carries with it a high risk of complications and morbidity.  The differential diagnosis includes but is not limited to Acute coronary syndrome, pneumonia, acute pulmonary edema, pneumothorax, acute anemia, pulmonary embolism Asthma exacerbation  Comorbidities that complicate the patient evaluation: Patients presentation is complicated by their history of asthma  Social Determinants of Health: Patients work stressors  increases the complexity of managing their presentation  Additional history obtained: Additional history obtained from mother  Lab Tests: I Ordered, and personally interpreted labs.  The pertinent results include: Labs  overall unremarkable  Imaging Studies ordered: Chest x-ray performed at other ER was reviewed and negative  Cardiac Monitoring: The patient was maintained on a cardiac monitor.  I personally viewed and interpreted the cardiac monitor which showed an underlying rhythm of:  sinus rhythm  Medicines ordered and prescription drug management: I ordered medication including albuterol  for wheezing Reevaluation of the patient after these medicines showed that the patient    improved  Test Considered: Patient is low risk / negative by Wells criteria, therefore do not feel that CT chest for PE is indicated.  Critical Interventions:   albuterol  and Decadron    Reevaluation: After the interventions noted above, I reevaluated the patient and found that they have :improved  Complexity of problems addressed: Patients presentation is most consistent with  acute presentation with potential threat to life or bodily function  Disposition: After consideration of the diagnostic results and the patients response to treatment,  I feel that the patent would benefit from discharge  .        Final diagnoses:  Acute bronchospasm due to viral infection    ED Discharge Orders          Ordered    chlorpheniramine-HYDROcodone (TUSSIONEX) 10-8 MG/5ML  At bedtime PRN        06/15/24 0515               Midge Golas, MD 06/15/24 0518

## 2024-06-15 NOTE — ED Triage Notes (Signed)
 Pt reports cough for about a week, dx with bronchitis and taking antibiotics. She reports her symptoms are not any better. She is having central chest pain, shortness of breath, bilateral rib pain. Last breathing treatment yesterday afternoon. See at [facility] in [place], told to come to the ED for r/o PE per patient.
